# Patient Record
Sex: Male | Born: 2000 | Hispanic: Yes | Marital: Single | Smoking: Never smoker
Health system: Southern US, Community
[De-identification: ages and names within clinical notes are randomized; demographics above are authoritative.]

## PROBLEM LIST (undated history)

## (undated) DIAGNOSIS — S9030XA Contusion of unspecified foot, initial encounter: Secondary | ICD-10-CM

## (undated) DIAGNOSIS — R197 Diarrhea, unspecified: Secondary | ICD-10-CM

## (undated) DIAGNOSIS — N3944 Nocturnal enuresis: Secondary | ICD-10-CM

## (undated) DIAGNOSIS — Z23 Encounter for immunization: Secondary | ICD-10-CM

## (undated) DIAGNOSIS — J309 Allergic rhinitis, unspecified: Secondary | ICD-10-CM

## (undated) DIAGNOSIS — S161XXS Strain of muscle, fascia and tendon at neck level, sequela: Secondary | ICD-10-CM

## (undated) DIAGNOSIS — F32A Depression, unspecified: Secondary | ICD-10-CM

## (undated) DIAGNOSIS — R269 Unspecified abnormalities of gait and mobility: Secondary | ICD-10-CM

## (undated) DIAGNOSIS — F329 Major depressive disorder, single episode, unspecified: Secondary | ICD-10-CM

## (undated) DIAGNOSIS — J4520 Mild intermittent asthma, uncomplicated: Secondary | ICD-10-CM

## (undated) DIAGNOSIS — S060X9A Concussion with loss of consciousness of unspecified duration, initial encounter: Secondary | ICD-10-CM

## (undated) DIAGNOSIS — L209 Atopic dermatitis, unspecified: Secondary | ICD-10-CM

## (undated) DIAGNOSIS — R519 Headache, unspecified: Secondary | ICD-10-CM

## (undated) DIAGNOSIS — S060XAA Concussion with loss of consciousness status unknown, initial encounter: Secondary | ICD-10-CM

## (undated) DIAGNOSIS — H1013 Acute atopic conjunctivitis, bilateral: Secondary | ICD-10-CM

## (undated) DIAGNOSIS — Z87448 Personal history of other diseases of urinary system: Secondary | ICD-10-CM

## (undated) DIAGNOSIS — F909 Attention-deficit hyperactivity disorder, unspecified type: Secondary | ICD-10-CM

## (undated) HISTORY — DX: Personal history of other diseases of urinary system: Z87.448

## (undated) HISTORY — PX: CIRCUMCISION: SUR203

## (undated) HISTORY — DX: Unspecified abnormalities of gait and mobility: R26.9

## (undated) HISTORY — DX: Mild intermittent asthma, uncomplicated: J45.20

## (undated) HISTORY — DX: Concussion with loss of consciousness status unknown, initial encounter: S06.0XAA

## (undated) HISTORY — DX: Depression, unspecified: F32.A

## (undated) HISTORY — DX: Acute atopic conjunctivitis, bilateral: J30.9

## (undated) HISTORY — DX: Concussion with loss of consciousness of unspecified duration, initial encounter: S06.0X9A

## (undated) HISTORY — DX: Headache, unspecified: R51.9

## (undated) HISTORY — DX: Atopic dermatitis, unspecified: L20.9

## (undated) HISTORY — DX: Attention-deficit hyperactivity disorder, unspecified type: F90.9

## (undated) HISTORY — DX: Strain of muscle, fascia and tendon at neck level, sequela: S16.1XXS

## (undated) HISTORY — DX: Allergic rhinitis, unspecified: H10.13

---
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
## 2022-01-08 ENCOUNTER — Other Ambulatory Visit: Payer: Self-pay | Admitting: Family Medicine

## 2022-01-08 DIAGNOSIS — M533 Sacrococcygeal disorders, not elsewhere classified: Secondary | ICD-10-CM

## 2022-02-27 ENCOUNTER — Encounter: Payer: Self-pay | Admitting: *Deleted

## 2022-03-20 ENCOUNTER — Ambulatory Visit (INDEPENDENT_AMBULATORY_CARE_PROVIDER_SITE_OTHER): Payer: Self-pay | Admitting: Physician Assistant

## 2022-03-20 DIAGNOSIS — Z91199 Patient's noncompliance with other medical treatment and regimen due to unspecified reason: Secondary | ICD-10-CM

## 2022-03-20 NOTE — Progress Notes (Signed)
No show

## 2022-03-30 DIAGNOSIS — S22019A Unspecified fracture of first thoracic vertebra, initial encounter for closed fracture: Secondary | ICD-10-CM | POA: Diagnosis not present

## 2022-03-30 DIAGNOSIS — M542 Cervicalgia: Secondary | ICD-10-CM | POA: Diagnosis not present

## 2022-03-30 DIAGNOSIS — R079 Chest pain, unspecified: Secondary | ICD-10-CM | POA: Diagnosis not present

## 2022-03-30 DIAGNOSIS — S134XXA Sprain of ligaments of cervical spine, initial encounter: Secondary | ICD-10-CM | POA: Diagnosis not present

## 2022-03-30 DIAGNOSIS — S3992XA Unspecified injury of lower back, initial encounter: Secondary | ICD-10-CM | POA: Diagnosis not present

## 2022-03-30 DIAGNOSIS — S299XXA Unspecified injury of thorax, initial encounter: Secondary | ICD-10-CM | POA: Diagnosis not present

## 2022-03-30 DIAGNOSIS — M25519 Pain in unspecified shoulder: Secondary | ICD-10-CM | POA: Diagnosis not present

## 2022-03-30 DIAGNOSIS — X58XXXA Exposure to other specified factors, initial encounter: Secondary | ICD-10-CM | POA: Diagnosis not present

## 2022-03-30 DIAGNOSIS — S0990XA Unspecified injury of head, initial encounter: Secondary | ICD-10-CM | POA: Diagnosis not present

## 2022-03-30 DIAGNOSIS — S199XXA Unspecified injury of neck, initial encounter: Secondary | ICD-10-CM | POA: Diagnosis not present

## 2022-03-30 DIAGNOSIS — S3991XA Unspecified injury of abdomen, initial encounter: Secondary | ICD-10-CM | POA: Diagnosis not present

## 2022-03-30 DIAGNOSIS — S3993XA Unspecified injury of pelvis, initial encounter: Secondary | ICD-10-CM | POA: Diagnosis not present

## 2022-03-30 DIAGNOSIS — S6992XA Unspecified injury of left wrist, hand and finger(s), initial encounter: Secondary | ICD-10-CM | POA: Diagnosis not present

## 2022-03-31 DIAGNOSIS — S22019A Unspecified fracture of first thoracic vertebra, initial encounter for closed fracture: Secondary | ICD-10-CM | POA: Diagnosis not present

## 2022-03-31 DIAGNOSIS — S134XXA Sprain of ligaments of cervical spine, initial encounter: Secondary | ICD-10-CM | POA: Diagnosis not present

## 2022-04-02 ENCOUNTER — Encounter: Payer: Self-pay | Admitting: Family Medicine

## 2022-04-02 ENCOUNTER — Ambulatory Visit: Payer: Federal, State, Local not specified - PPO | Admitting: Family Medicine

## 2022-04-02 VITALS — BP 100/65 | HR 82 | Temp 97.5°F | Ht 66.0 in | Wt 150.0 lb

## 2022-04-02 DIAGNOSIS — B351 Tinea unguium: Secondary | ICD-10-CM

## 2022-04-02 DIAGNOSIS — Z23 Encounter for immunization: Secondary | ICD-10-CM

## 2022-04-02 DIAGNOSIS — S22010A Wedge compression fracture of first thoracic vertebra, initial encounter for closed fracture: Secondary | ICD-10-CM

## 2022-04-02 LAB — COMPREHENSIVE METABOLIC PANEL
ALT: 23 U/L (ref 0–53)
AST: 27 U/L (ref 0–37)
Albumin: 4.8 g/dL (ref 3.5–5.2)
Alkaline Phosphatase: 111 U/L (ref 52–171)
BUN: 17 mg/dL (ref 6–23)
CO2: 31 mEq/L (ref 19–32)
Calcium: 9.9 mg/dL (ref 8.4–10.5)
Chloride: 102 mEq/L (ref 96–112)
Creatinine, Ser: 1.1 mg/dL (ref 0.40–1.50)
GFR: 97.57 mL/min (ref >60.00)
Glucose, Bld: 92 mg/dL (ref 70–99)
Potassium: 4.7 mEq/L (ref 3.5–5.1)
Sodium: 139 mEq/L (ref 135–145)
Total Bilirubin: 0.6 mg/dL (ref 0.2–1.2)
Total Protein: 7.8 g/dL (ref 6.0–8.3)

## 2022-04-02 LAB — CBC
HCT: 47.1 % (ref 36.0–49.0)
Hemoglobin: 16.1 g/dL — ABNORMAL HIGH (ref 12.0–16.0)
MCHC: 34.3 g/dL (ref 31.0–37.0)
MCV: 94.8 fl (ref 78.0–98.0)
Platelets: 236 10*3/uL (ref 150.0–575.0)
RBC: 4.97 Mil/uL (ref 3.80–5.70)
RDW: 13.5 % (ref 11.4–15.5)
WBC: 6.1 10*3/uL (ref 4.5–13.5)

## 2022-04-02 MED ORDER — TERBINAFINE HCL 250 MG PO TABS: 250.0000 mg | ORAL_TABLET | Freq: Every day | ORAL | 0 refills | Status: DC

## 2022-04-02 NOTE — Progress Notes (Addendum)
   Rick Knight is a 22 y.o. male who presents today for an office visit.  Assessment/Plan:  T1 Compression Fracture  Reassuring neuroexam today.  Did not have any signs of spinal cord injury on MRI of the C-spine or T-spine in the ED a few days ago.  Does not have any concussion symptoms either.  He has been wearing his C-spine collar consistently.  Pain is reasonably well-controlled with over-the-counter medications.  We will refer him to see a neurosurgeon for further evaluation management.  Anticipate he will do well going forward.  Onychomycosis Have tried topical creams without much improvement.  They are interested in oral treatment.  Will check labs today.  If transaminases are normal we will start terbinafine.   Flu shot given today.    Subjective:  HPI:  Patient here for follow-up.  We last saw him a little over a year ago.He is here today for ED follow-up.  Went to ED 3 days ago after suffering a trauma.  He was at a trampoline park when he did a flip and landed on his head and neck area.  Had immediate pain to the area.  He did have intermittent weakness in his left hand which improved.  In the ED ended up getting CT scan and MRI.  Did not have any signs of spinal cord injury however was found to have an acute superior endplate fracture of T1 and contusions of T2 and T3.  His Ct scan did show that he also had some soft tissue injury in his neck. He was placed in a cervical collar and told to follow-up with neurosurgery.  Over the last few days he has had continued pain in his neck.  He has been taking over-the-counter medications for this which has been working well.  Does occasionally get intermittent numbness and tingling.  No weakness.  No headache.  No vision changes.  No bowel or bladder incontinence.  Numbness and tingling last for a few months it goes away.  Can happen sporadically in his arms or back.  Still has quite a bit of pain and upper back and neck with motion and  activities.        Objective:  Physical Exam: BP 100/65   Pulse 82   Temp (!) 97.5 F (36.4 C) (Temporal)   Ht 5\' 6"  (1.676 m)   Wt 150 lb (68 kg)   SpO2 98%   BMI 24.21 kg/m   Gen: No acute distress, resting comfortably CV: Regular rate and rhythm with no murmurs appreciated Pulm: Normal work of breathing, clear to auscultation bilaterally with no crackles, wheezes, or rhonchi MSK: Cervical collar in place.  Moves all extremities spontaneously.  Strength 5 out of 5 throughout.  Sensation light touch grossly intact throughout. - Foot: Onychomycosis noted. Neuro: Grossly normal, moves all extremities Psych: Normal affect and thought content  Time Spent: 45 minutes of total time was spent on the date of the encounter performing the following actions: chart review prior to seeing the patient including recent ED visit, obtaining history, performing a medically necessary exam, counseling on the treatment plan, placing orders, and documenting in our EHR.        Rick Knight. Jerline Pain, MD 04/02/2022 9:35 AM

## 2022-04-02 NOTE — Patient Instructions (Signed)
It was very nice to see you today!  We will refer you to see the neurosurgeon.  Will check blood work today.  Please do not start the terbinafine until we get your liver test back.  Take care, Dr Jerline Pain  PLEASE NOTE:  If you had any lab tests, please let us know if you have not heard back within a few days. You may see your results on mychart before we have a chance to review them but we will give you a call once they are reviewed by [place].   If we ordered any referrals today, please let us know if you have not heard from their office within the next week.   If you had any urgent prescriptions sent in today, please check with the pharmacy within an hour of our visit to make sure the prescription was transmitted appropriately.   Please try these tips to maintain a healthy lifestyle:  Eat at least 3 REAL meals and 1-2 snacks per day.  Aim for no more than 5 hours between eating.  If you eat breakfast, please do so within one hour of getting up.   Each meal should contain half fruits/vegetables, one quarter protein, and one quarter carbs (no bigger than a computer mouse)  Cut down on sweet beverages. This includes juice, soda, and sweet tea.   Drink at least 1 glass of water with each meal and aim for at least 8 glasses per day  Exercise at least 150 minutes every week.

## 2022-04-04 ENCOUNTER — Encounter: Payer: Self-pay | Admitting: Family Medicine

## 2022-04-04 NOTE — Progress Notes (Signed)
Please inform patient of the following:  Liver function is normal.  It is okay for him to start the terbinafine as we discussed at his office visit.

## 2022-04-06 ENCOUNTER — Encounter: Payer: Self-pay | Admitting: Family Medicine

## 2022-04-07 NOTE — Telephone Encounter (Signed)
See note

## 2022-04-08 NOTE — Telephone Encounter (Signed)
Ok to send updated letter with the requested information.  Algis Greenhouse. Jerline Pain, MD 04/08/2022 8:33 AM

## 2022-04-08 NOTE — Telephone Encounter (Signed)
Patient called for an update on revision of letter for his mother's employer. Requests this be done as soon as possible since mother needs to provide this to employer ASAP.

## 2022-04-09 ENCOUNTER — Encounter: Payer: Self-pay | Admitting: *Deleted

## 2022-04-09 ENCOUNTER — Encounter: Payer: Self-pay | Admitting: Family Medicine

## 2022-04-14 DIAGNOSIS — S22010A Wedge compression fracture of first thoracic vertebra, initial encounter for closed fracture: Secondary | ICD-10-CM | POA: Diagnosis not present

## 2022-04-14 DIAGNOSIS — Z6825 Body mass index (BMI) 25.0-25.9, adult: Secondary | ICD-10-CM | POA: Diagnosis not present

## 2022-04-16 ENCOUNTER — Ambulatory Visit: Payer: Federal, State, Local not specified - PPO | Admitting: Adult Health

## 2022-04-28 DIAGNOSIS — S22010A Wedge compression fracture of first thoracic vertebra, initial encounter for closed fracture: Secondary | ICD-10-CM | POA: Diagnosis not present

## 2022-04-28 DIAGNOSIS — Z6825 Body mass index (BMI) 25.0-25.9, adult: Secondary | ICD-10-CM | POA: Diagnosis not present

## 2022-04-29 ENCOUNTER — Telehealth (INDEPENDENT_AMBULATORY_CARE_PROVIDER_SITE_OTHER): Payer: Federal, State, Local not specified - PPO | Admitting: Physician Assistant

## 2022-04-29 ENCOUNTER — Encounter: Payer: Self-pay | Admitting: Physician Assistant

## 2022-04-29 DIAGNOSIS — F900 Attention-deficit hyperactivity disorder, predominantly inattentive type: Secondary | ICD-10-CM | POA: Diagnosis not present

## 2022-04-29 DIAGNOSIS — F341 Dysthymic disorder: Secondary | ICD-10-CM

## 2022-04-29 MED ORDER — LISDEXAMFETAMINE DIMESYLATE 50 MG PO CAPS: 50.0000 mg | ORAL_CAPSULE | Freq: Every day | ORAL | 0 refills | Status: DC

## 2022-04-29 MED ORDER — BUPROPION HCL ER (XL) 300 MG PO TB24: 300.0000 mg | ORAL_TABLET | Freq: Every day | ORAL | 1 refills | Status: DC

## 2022-04-29 NOTE — Progress Notes (Signed)
[facility]  Patient ID: Rick Knight,  MRN: [id number]  PCP: Vivi Barrack, MD  Date of Evaluation: 04/29/2022 time spent:20 minutes  Chief Complaint:  Chief Complaint   Follow-up    Virtual Visit via Telehealth  I connected with patient by a video enabled telemedicine application with their informed consent, and verified patient privacy and that I am speaking with the correct person using two identifiers.  I am private, in my office and the patient is at school.  I discussed the limitations, risks, security and privacy concerns of performing an evaluation and management service by video and the availability of in person appointments. I also discussed with the patient that there may be a patient responsible charge related to this service. The patient expressed understanding and agreed to proceed.   I discussed the assessment and treatment plan with the patient. The patient was provided an opportunity to ask questions and all were answered. The patient agreed with the plan and demonstrated an understanding of the instructions.   The patient was advised to call back or seek an in-person evaluation if the symptoms worsen or if the condition fails to improve as anticipated.  I provided 20  minutes of non-face-to-face time during this encounter.  HISTORY/CURRENT STATUS: HPI For routine med check.  Fx T1 in January when at a trampoline park, he did a flip and landed on his head.  No surgical intervention. Had to drop classes at [employer] and moved back home, is taking some online classes. Missing part of diving season, he was pretty down about it to begin with but now he feels ok. 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   Patient is able to enjoy things.  Energy and motivation are good.  No extreme sadness, tearfulness, or feelings of hopelessness.  Sleeps well most of the time. ADLs and personal hygiene are normal.    Appetite has not changed.  Weight is  stable. No anxiety to speak of.  Denies suicidal or homicidal thoughts.  States that attention is good without easy distractibility.  Able to focus on things and finish tasks to completion.   Patient denies increased energy with decreased need for sleep, increased talkativeness, racing thoughts, impulsivity or risky behaviors, increased spending, increased libido, grandiosity, increased irritability or anger, paranoia, or hallucinations.  Denies dizziness, syncope, seizures, numbness, tingling, tremor, tics, unsteady gait, slurred speech, confusion. Denies muscle or joint pain, stiffness, or dystonia.  Individual Medical History/ Review of Systems: Changes? :Yes    fx T1 on Jan 14th.   Past medications for mental health diagnoses include: Vyvanse, Wellbutrin  Allergies: Patient has no known allergies.  Current Medications:  Current Outpatient Medications:    terbinafine (LAMISIL) 250 MG tablet, Take 1 tablet (250 mg total) by mouth daily., Disp: 90 tablet, Rfl: 0   buPROPion (WELLBUTRIN XL) 300 MG 24 hr tablet, Take 1 tablet (300 mg total) by mouth daily after breakfast., Disp: 90 tablet, Rfl: 1   lisdexamfetamine (VYVANSE) 50 MG capsule, Take 1 capsule (50 mg total) by mouth daily., Disp: 30 capsule, Rfl: 0 Medication Side Effects: none  Family Medical/ Social History: Changes?  See HPI  MENTAL HEALTH EXAM:  There were no vitals taken for this visit.There is no height or weight on file to calculate BMI.  General Appearance: Casual and Well Groomed  Eye Contact:  Good  Speech:  Clear and Coherent and Normal Rate  Volume:  Normal  Mood:  Euthymic  Affect:  Appropriate  Thought Process:  Goal Directed and Descriptions of Associations: Circumstantial  Orientation:  Full (Time, Place, and Person)  Thought Content: Logical   Suicidal Thoughts:  No  Homicidal Thoughts:  No  Memory:  WNL  Judgement:  Good  Insight:  Good  Psychomotor Activity:  Normal  Concentration:   Concentration: Good and Attention Span: Good  Recall:  Good  Fund of Knowledge: Good  Language: Good  Assets:  Desire for Improvement Financial Resources/Insurance Housing Resilience Transportation  ADL's:  Intact  Cognition: WNL  Prognosis:  Good   DIAGNOSES:    ICD-10-CM   1. Attention deficit hyperactivity disorder (ADHD), inattentive type, moderate  F90.0 buPROPion (WELLBUTRIN XL) 300 MG 24 hr tablet    2. Persistent depressive disorder with mixed features, currently moderate  F34.1 buPROPion (WELLBUTRIN XL) 300 MG 24 hr tablet     Receiving Psychotherapy: No   RECOMMENDATIONS:  PDMP was reviewed.  Vyvanse last filled 06/14/2021. I provided 20 minutes of non-face-to-face time during this encounter, including time spent before and after the visit in records review, medical decision making, counseling pertinent to today's visit, and charting.   I'm glad he's recovering well from his injury. His coach states he needs to be in counseling d/t what has happened w/ the injury. I've recommended some counselors in the area, he'll see if they're covered on his insurance and get an appt with one of them.   He's doing well w/ current meds so no changes needed.   Continue Wellbutrin XL 300 mg, 1 p.o. every morning after breakfast. Continue Vyvanse 50 mg, 1 every morning after breakfast.  He takes it as needed. Return in 4 months  Donnal Moat, [place]

## 2022-05-07 DIAGNOSIS — Z1152 Encounter for screening for COVID-19: Secondary | ICD-10-CM | POA: Diagnosis not present

## 2022-05-07 DIAGNOSIS — R0982 Postnasal drip: Secondary | ICD-10-CM | POA: Diagnosis not present

## 2022-05-07 DIAGNOSIS — J069 Acute upper respiratory infection, unspecified: Secondary | ICD-10-CM | POA: Diagnosis not present

## 2022-05-08 DIAGNOSIS — K080 Exfoliation of teeth due to systemic causes: Secondary | ICD-10-CM | POA: Diagnosis not present

## 2022-06-02 ENCOUNTER — Other Ambulatory Visit: Payer: Self-pay | Admitting: Physician Assistant

## 2022-06-02 ENCOUNTER — Telehealth: Payer: Self-pay | Admitting: Physician Assistant

## 2022-06-02 ENCOUNTER — Other Ambulatory Visit (HOSPITAL_BASED_OUTPATIENT_CLINIC_OR_DEPARTMENT_OTHER): Payer: Self-pay

## 2022-06-02 MED ORDER — LISDEXAMFETAMINE DIMESYLATE 50 MG PO CAPS
50.0000 mg | ORAL_CAPSULE | Freq: Every day | ORAL | 0 refills | Status: DC
  Filled 2022-06-02: qty 30, 30d supply, fill #0

## 2022-06-02 NOTE — Telephone Encounter (Signed)
Rick Knight called to request refill for Vyvanse, he said chewable,  Appt 6/12.  Send to [place] at [facility].

## 2022-06-02 NOTE — Telephone Encounter (Signed)
sent 

## 2022-06-04 ENCOUNTER — Other Ambulatory Visit (HOSPITAL_BASED_OUTPATIENT_CLINIC_OR_DEPARTMENT_OTHER): Payer: Self-pay

## 2022-06-30 ENCOUNTER — Encounter: Payer: Self-pay | Admitting: *Deleted

## 2022-06-30 DIAGNOSIS — S22010A Wedge compression fracture of first thoracic vertebra, initial encounter for closed fracture: Secondary | ICD-10-CM | POA: Diagnosis not present

## 2022-07-29 ENCOUNTER — Other Ambulatory Visit: Payer: Self-pay | Admitting: Family Medicine

## 2022-08-27 ENCOUNTER — Encounter: Payer: Self-pay | Admitting: Physician Assistant

## 2022-08-27 ENCOUNTER — Other Ambulatory Visit (HOSPITAL_BASED_OUTPATIENT_CLINIC_OR_DEPARTMENT_OTHER): Payer: Self-pay

## 2022-08-27 ENCOUNTER — Telehealth (INDEPENDENT_AMBULATORY_CARE_PROVIDER_SITE_OTHER): Payer: Federal, State, Local not specified - PPO | Admitting: Physician Assistant

## 2022-08-27 DIAGNOSIS — F900 Attention-deficit hyperactivity disorder, predominantly inattentive type: Secondary | ICD-10-CM

## 2022-08-27 DIAGNOSIS — F325 Major depressive disorder, single episode, in full remission: Secondary | ICD-10-CM | POA: Diagnosis not present

## 2022-08-27 MED ORDER — BUPROPION HCL ER (SR) 100 MG PO TB12
ORAL_TABLET | ORAL | 0 refills | Status: DC
  Filled 2022-08-27: qty 42, 28d supply, fill #0
  Filled 2022-08-27: qty 21, fill #0

## 2022-08-27 MED ORDER — LISDEXAMFETAMINE DIMESYLATE 30 MG PO CAPS
30.0000 mg | ORAL_CAPSULE | Freq: Every day | ORAL | 0 refills | Status: DC
  Filled 2022-08-27: qty 30, 30d supply, fill #0

## 2022-08-27 NOTE — Progress Notes (Signed)
[facility]  Patient ID: Rick Knight,  MRN: [id number]  PCP: Ardith Dark, MD  Date of Evaluation: 08/27/2022 time spent:20 minutes  Chief Complaint:  Chief Complaint   Depression; ADHD; Follow-up    Virtual Visit via Telehealth  I connected with patient by a video enabled telemedicine application with their informed consent, and verified patient privacy and that I am speaking with the correct person using two identifiers.  I am private, in my office and the patient is at school.  I discussed the limitations, risks, security and privacy concerns of performing an evaluation and management service by video and the availability of in person appointments. I also discussed with the patient that there may be a patient responsible charge related to this service. The patient expressed understanding and agreed to proceed.   I discussed the assessment and treatment plan with the patient. The patient was provided an opportunity to ask questions and all were answered. The patient agreed with the plan and demonstrated an understanding of the instructions.   The patient was advised to call back or seek an in-person evaluation if the symptoms worsen or if the condition fails to improve as anticipated.  I provided 20  minutes of non-face-to-face time during this encounter.  HISTORY/CURRENT STATUS: HPI For routine med check.  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 But there are issues at school and the pool so unable to practice. Glad to be healthy enough to do it when the pool is ready.   Asks about going off the Wellbutrin. We've discussed in the past but it didn't seem like a good time.  He feels okay as far as his mood goes.  No depression symptoms at all.  Patient is able to enjoy things.  Energy and motivation are good.  No extreme sadness, tearfulness, or feelings of hopelessness.  Sleeps well most of the time. ADLs and personal hygiene are normal.  Appetite has not  changed.  Weight is stable. Denies suicidal or homicidal thoughts.  He has not been able to get the Vyvanse in several months.  And the last time he had it was the capsule as chewable was not available.  He has been taking the 50 mg and only taking half.  That has been effective.  At one point he was on Vyvanse 20 mg capsules and that was not quite enough.  He has had trouble focusing since being off of it.  School is going okay though.  He is not working at the moment.  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  Then he will go back to school in the late summer.  Patient denies increased energy with decreased need for sleep, increased talkativeness, racing thoughts, impulsivity or risky behaviors, increased spending, increased libido, grandiosity, increased irritability or anger, paranoia, or hallucinations.  Denies dizziness, syncope, seizures, numbness, tingling, tremor, tics, unsteady gait, slurred speech, confusion. Denies muscle or joint pain, stiffness, or dystonia.  Individual Medical History/ Review of Systems: Changes? :No      Past medications for mental health diagnoses include: Vyvanse, Wellbutrin  Allergies: Patient has no known allergies.  Current Medications:  Current Outpatient Medications:    buPROPion ER (WELLBUTRIN SR) 100 MG 12 hr tablet, 1 tablet (100 mg total) 2 (two) times daily for 14 days, THEN 1 tablet (100 mg total) every morning for 14 days, THEN stop, Disp: 42 tablet, Rfl:  0   lisdexamfetamine (VYVANSE) 30 MG capsule, Take 1 capsule (30 mg total) by mouth daily., Disp: 30 capsule, Rfl: 0 Medication Side Effects: none  Family Medical/ Social History: Changes?  See HPI  MENTAL HEALTH EXAM:  There were no vitals taken for this visit.There is no height or weight on file to calculate BMI.  General Appearance: Casual and Well Groomed  Eye Contact:  Good  Speech:  Clear and Coherent and Normal  Rate  Volume:  Normal  Mood:  Euthymic  Affect:  Appropriate  Thought Process:  Goal Directed and Descriptions of Associations: Circumstantial  Orientation:  Full (Time, Place, and Person)  Thought Content: Logical   Suicidal Thoughts:  No  Homicidal Thoughts:  No  Memory:  WNL  Judgement:  Good  Insight:  Good  Psychomotor Activity:  Normal  Concentration:  Concentration: Good and Attention Span: Good  Recall:  Good  Fund of Knowledge: Good  Language: Good  Assets:  Desire for Improvement Financial Resources/Insurance Housing Resilience Transportation  ADL's:  Intact  Cognition: WNL  Prognosis:  Good   DIAGNOSES:    ICD-10-CM   1. Attention deficit hyperactivity disorder (ADHD), inattentive type, moderate  F90.0     2. Major depressive disorder with single episode, in full remission (HCC)  F32.5      Receiving Psychotherapy: No   RECOMMENDATIONS:  PDMP was reviewed.  Vyvanse last filled 06/14/2021. I provided 20  minutes of non-face-to-face time during this encounter, including time spent before and after the visit in records review, medical decision making, counseling pertinent to today's visit, and charting.   We discussed the medications.  On the Wellbutrin, it is fine to wean off now.  On the Vyvanse I will change to a 30 mg capsule, hopefully he is able to get that.  If he has trouble with either of the medications he will let me know.  Change Wellbutrin to SR 100 mg, 1 p.o. twice daily (he knows not to take the last pill to close to bedtime or it might keep him awake.  Advised to take no later than 2-ish) for 2 weeks, then 1 p.o. every morning for 2 weeks and then stop. Change Vyvanse to 30 mg capsule, 1 p.o. every morning.   Return in 8 weeks.  Melony Overly, PA-C

## 2022-10-20 ENCOUNTER — Telehealth: Payer: Federal, State, Local not specified - PPO | Admitting: Physician Assistant

## 2022-12-11 ENCOUNTER — Telehealth (INDEPENDENT_AMBULATORY_CARE_PROVIDER_SITE_OTHER): Payer: Federal, State, Local not specified - PPO | Admitting: Physician Assistant

## 2022-12-11 ENCOUNTER — Encounter: Payer: Self-pay | Admitting: Physician Assistant

## 2022-12-11 DIAGNOSIS — F3341 Major depressive disorder, recurrent, in partial remission: Secondary | ICD-10-CM | POA: Diagnosis not present

## 2022-12-11 DIAGNOSIS — F900 Attention-deficit hyperactivity disorder, predominantly inattentive type: Secondary | ICD-10-CM | POA: Diagnosis not present

## 2022-12-11 NOTE — Progress Notes (Signed)
[facility]  Patient ID: Rick Knight,  MRN: [id number]  PCP: Ardith Dark, MD  Date of Evaluation: 12/11/2022 time spent: 22 minutes  Chief Complaint:  Chief Complaint   Depression; ADHD   Virtual Visit via Telehealth  I connected with patient by telephone, with their informed consent, and verified patient privacy and that I am speaking with the correct person using two identifiers.  I am private, in my office and the patient is at school.  I discussed the limitations, risks, security and privacy concerns of performing an evaluation and management service by telephone and the availability of in person appointments. I also discussed with the patient that there may be a patient responsible charge related to this service. The patient expressed understanding and agreed to proceed.   I discussed the assessment and treatment plan with the patient. The patient was provided an opportunity to ask questions and all were answered. The patient agreed with the plan and demonstrated an understanding of the instructions.   The patient was advised to call back or seek an in-person evaluation if the symptoms worsen or if the condition fails to improve as anticipated.  I provided 22 minutes of non-face-to-face time during this encounter.  HISTORY/CURRENT STATUS: HPI For routine med check.  Rick Knight is doing well.  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 Grades are good. Meds are working well. States that attention is good without easy distractibility.  Able to focus on things and finish tasks to completion.   Patient is able to enjoy things.  Energy and motivation are good.  No extreme sadness, tearfulness, or feelings of hopelessness.  Sleeps well most of the time. ADLs and personal hygiene are normal.  Appetite has not changed.  Weight is stable. 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  Denies suicidal or homicidal thoughts.  Patient denies increased energy with  decreased need for sleep, increased talkativeness, racing thoughts, impulsivity or risky behaviors, increased spending, increased libido, grandiosity, increased irritability or anger, paranoia, or hallucinations.  Denies dizziness, syncope, seizures, numbness, tingling, tremor, tics, unsteady gait, slurred speech, confusion. Denies muscle or joint pain, stiffness, or dystonia.  Individual Medical History/ Review of Systems: Changes? :No      Past medications for mental health diagnoses include: Vyvanse, Wellbutrin  Allergies: Patient has no known allergies.  Current Medications:  Current Outpatient Medications:    lisdexamfetamine (VYVANSE) 30 MG capsule, Take 1 capsule (30 mg total) by mouth daily., Disp: 30 capsule, Rfl: 0 Medication Side Effects: none  Family Medical/ Social History: Changes?  See HPI  MENTAL HEALTH EXAM:  There were no vitals taken for this visit.There is no height or weight on file to calculate BMI.  General Appearance:  unable to assess  Eye Contact:   unable to assess  Speech:  Clear and Coherent and Normal Rate  Volume:  Normal  Mood:  Euthymic  Affect:   unable to assess  Thought Process:  Goal Directed and Descriptions of Associations: Circumstantial  Orientation:  Full (Time, Place, and Person)  Thought Content: Logical   Suicidal Thoughts:  No  Homicidal Thoughts:  No  Memory:  WNL  Judgement:  Good  Insight:  Good  Psychomotor Activity:   Unable to assess as  Concentration:  Concentration: Good and Attention Span: Good  Recall:  Good  Fund of Knowledge: Good  Language: Good  Assets:  Desire for Improvement Financial Resources/Insurance Housing Physical Health Resilience Transportation  ADL's:  Intact  Cognition: WNL  Prognosis:  Good   DIAGNOSES:    ICD-10-CM   1. Attention deficit hyperactivity disorder (ADHD), inattentive type, moderate  F90.0     2. Recurrent major depressive disorder, in partial remission (HCC)  F33.41       Receiving Psychotherapy: No   RECOMMENDATIONS:  PDMP was reviewed.  Vyvanse last filled 08/27/2022.  I provided 22 minutes of non-face-to-face time during this encounter, including time spent before and after the visit in records review, medical decision making, counseling pertinent to today's visit, and charting.   He's doing well so no change needed. Continue healthy lifestyle choices.  Continue Vyvanse 30 mg capsule, 1 p.o. every morning.   Return in 3 months.  Melony Overly, PA-C

## 2022-12-29 ENCOUNTER — Other Ambulatory Visit: Payer: Self-pay

## 2022-12-29 ENCOUNTER — Telehealth: Payer: Self-pay | Admitting: Physician Assistant

## 2022-12-29 MED ORDER — LISDEXAMFETAMINE DIMESYLATE 30 MG PO CAPS: 30.0000 mg | ORAL_CAPSULE | Freq: Every day | ORAL | 0 refills | Status: DC

## 2022-12-29 NOTE — Telephone Encounter (Signed)
Rick Knight called at 3:27 to request refill of his Vyvanse. Appt 12/30.  Send to [id number] - [place], [place] - [address] AT [facility] [address]   He thought you had already sent at the last appt 9/26.  But I don't see that it was done.

## 2022-12-29 NOTE — Telephone Encounter (Signed)
pended

## 2023-02-11 ENCOUNTER — Telehealth: Payer: Self-pay | Admitting: Physician Assistant

## 2023-02-11 ENCOUNTER — Other Ambulatory Visit: Payer: Self-pay

## 2023-02-11 MED ORDER — LISDEXAMFETAMINE DIMESYLATE 30 MG PO CAPS: 30.0000 mg | ORAL_CAPSULE | Freq: Every day | ORAL | 0 refills | Status: DC

## 2023-02-11 NOTE — Telephone Encounter (Signed)
Pended vyvanse to requested pharmacy.

## 2023-02-11 NOTE — Telephone Encounter (Signed)
Next visit is 03/16/23. Requesting refill on Vyvanse called to:  [id number] - [place], [place] - [address] AT [address]   Phone: [phone]  Fax: [phone]    It is in stock per patient.

## 2023-03-09 ENCOUNTER — Ambulatory Visit: Payer: Federal, State, Local not specified - PPO | Admitting: Physician Assistant

## 2023-03-16 ENCOUNTER — Ambulatory Visit: Payer: Federal, State, Local not specified - PPO | Admitting: Physician Assistant

## 2023-04-16 ENCOUNTER — Telehealth: Payer: Federal, State, Local not specified - PPO | Admitting: Physician Assistant

## 2023-04-16 ENCOUNTER — Encounter: Payer: Self-pay | Admitting: Physician Assistant

## 2023-04-16 DIAGNOSIS — F900 Attention-deficit hyperactivity disorder, predominantly inattentive type: Secondary | ICD-10-CM | POA: Diagnosis not present

## 2023-04-16 MED ORDER — LISDEXAMFETAMINE DIMESYLATE 30 MG PO CAPS: 30.0000 mg | ORAL_CAPSULE | Freq: Every day | ORAL | 0 refills | Status: DC

## 2023-04-16 NOTE — Addendum Note (Signed)
Addended by: Melony Overly T on: 04/16/2023 05:26 PM   Modules accepted: Level of Service

## 2023-04-16 NOTE — Progress Notes (Signed)
[facility]  Patient ID: Rick Knight,  MRN: [id number]  PCP: Ardith Dark, MD  Date of Evaluation: 04/16/2023 time spent:20 minutes  Chief Complaint:  Chief Complaint   ADHD; Follow-up   Virtual Visit via Telehealth  I connected with patient by a video enabled telemedicine application with their informed consent, and verified patient privacy and that I am speaking with the correct person using two identifiers.  I am private, in my office and the patient is on campus at [facility].  I discussed the limitations, risks, security and privacy concerns of performing an evaluation and management service by video and the availability of in person appointments. I also discussed with the patient that there may be a patient responsible charge related to this service. The patient expressed understanding and agreed to proceed.   I discussed the assessment and treatment plan with the patient. The patient was provided an opportunity to ask questions and all were answered. The patient agreed with the plan and demonstrated an understanding of the instructions.   The patient was advised to call back or seek an in-person evaluation if the symptoms worsen or if the condition fails to improve as anticipated.  I provided 20  minutes of non-face-to-face time during this encounter.  HISTORY/CURRENT STATUS: HPI For routine med check.  He is doing very well.  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  Still enjoying that quite a bit, grades are good.  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  Vyvanse is still effective.  He does not take it every day. States that attention is good without easy distractibility.  Able to focus on things and finish tasks to completion.   Patient is able to enjoy things.  Energy and motivation are good.  No extreme sadness, tearfulness, or feelings of hopelessness.  Sleeps well most of the time. ADLs and personal hygiene are normal.  Appetite has not  changed.  Weight is stable.  No complaints of anxiety.  Denies suicidal or homicidal thoughts.  Patient denies increased energy with decreased need for sleep, increased talkativeness, racing thoughts, impulsivity or risky behaviors, increased spending, increased libido, grandiosity, increased irritability or anger, paranoia, or hallucinations.  Denies dizziness, syncope, seizures, numbness, tingling, tremor, tics, unsteady gait, slurred speech, confusion. Denies muscle or joint pain, stiffness, or dystonia.  Individual Medical History/ Review of Systems: Changes? :No      Past medications for mental health diagnoses include: Vyvanse, Wellbutrin  Allergies: Patient has no known allergies.  Current Medications:  Current Outpatient Medications:    lisdexamfetamine (VYVANSE) 30 MG capsule, Take 1 capsule (30 mg total) by mouth daily., Disp: 30 capsule, Rfl: 0   [START ON 05/15/2023] lisdexamfetamine (VYVANSE) 30 MG capsule, Take 1 capsule (30 mg total) by mouth daily., Disp: 30 capsule, Rfl: 0   [START ON 06/11/2023] lisdexamfetamine (VYVANSE) 30 MG capsule, Take 1 capsule (30 mg total) by mouth daily., Disp: 30 capsule, Rfl: 0 Medication Side Effects: none  Family Medical/ Social History: Changes?  See HPI  MENTAL HEALTH EXAM:  There were no vitals taken for this visit.There is no height or weight on file to calculate BMI.  General Appearance: Casual and Well Groomed  Eye Contact:  Good  Speech:  Clear and Coherent and Normal Rate  Volume:  Normal  Mood:  Euthymic  Affect:  Congruent  Thought Process:  Goal Directed and Descriptions of Associations: Circumstantial  Orientation:  Full (Time, Place, and  Person)  Thought Content: Logical   Suicidal Thoughts:  No  Homicidal Thoughts:  No  Memory:  WNL  Judgement:  Good  Insight:  Good  Psychomotor Activity:  Normal  Concentration:  Concentration: Good and Attention Span: Good  Recall:  Good  Fund of Knowledge: Good  Language: Good   Assets:  Desire for Improvement Financial Resources/Insurance Housing Physical Health Resilience Transportation  ADL's:  Intact  Cognition: WNL  Prognosis:  Good   DIAGNOSES:    ICD-10-CM   1. 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  Vyvanse last filled 02/11/2023. I provided 20 minutes of non-face-to-face time during this encounter, including time spent before and after the visit in records review, medical decision making, counseling pertinent to today's visit, and charting.   He is doing well on current treatment so no changes will be made.  Continue Vyvanse 30 mg capsule, 1 p.o. every morning, as needed. Return in 6 months.  Melony Overly, PA-C

## 2023-12-03 ENCOUNTER — Other Ambulatory Visit: Payer: Self-pay

## 2023-12-03 ENCOUNTER — Telehealth: Payer: Self-pay | Admitting: Physician Assistant

## 2023-12-03 DIAGNOSIS — F900 Attention-deficit hyperactivity disorder, predominantly inattentive type: Secondary | ICD-10-CM

## 2023-12-03 MED ORDER — LISDEXAMFETAMINE DIMESYLATE 30 MG PO CAPS
30.0000 mg | ORAL_CAPSULE | Freq: Every day | ORAL | 0 refills | Status: AC
Start: 2023-12-03 — End: ?

## 2023-12-03 NOTE — Telephone Encounter (Signed)
 Pended

## 2023-12-03 NOTE — Telephone Encounter (Signed)
 Pt called and has an apt 01/05/24. He also needs a refill on his vyvanse  30 mg. [facility] [address] in [place]

## 2023-12-23 DIAGNOSIS — Z13220 Encounter for screening for lipoid disorders: Secondary | ICD-10-CM | POA: Diagnosis not present

## 2023-12-23 DIAGNOSIS — Z136 Encounter for screening for cardiovascular disorders: Secondary | ICD-10-CM | POA: Diagnosis not present

## 2023-12-23 DIAGNOSIS — F331 Major depressive disorder, recurrent, moderate: Secondary | ICD-10-CM | POA: Diagnosis not present

## 2023-12-23 DIAGNOSIS — Z1330 Encounter for screening examination for mental health and behavioral disorders, unspecified: Secondary | ICD-10-CM | POA: Diagnosis not present

## 2023-12-23 DIAGNOSIS — Z131 Encounter for screening for diabetes mellitus: Secondary | ICD-10-CM | POA: Diagnosis not present

## 2023-12-23 DIAGNOSIS — Z0000 Encounter for general adult medical examination without abnormal findings: Secondary | ICD-10-CM | POA: Diagnosis not present

## 2023-12-25 DIAGNOSIS — Z136 Encounter for screening for cardiovascular disorders: Secondary | ICD-10-CM | POA: Diagnosis not present

## 2023-12-25 DIAGNOSIS — Z131 Encounter for screening for diabetes mellitus: Secondary | ICD-10-CM | POA: Diagnosis not present

## 2023-12-25 DIAGNOSIS — Z13220 Encounter for screening for lipoid disorders: Secondary | ICD-10-CM | POA: Diagnosis not present

## 2023-12-25 DIAGNOSIS — E291 Testicular hypofunction: Secondary | ICD-10-CM | POA: Diagnosis not present

## 2023-12-25 DIAGNOSIS — F900 Attention-deficit hyperactivity disorder, predominantly inattentive type: Secondary | ICD-10-CM | POA: Diagnosis not present
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
## 2024-01-05 ENCOUNTER — Telehealth (INDEPENDENT_AMBULATORY_CARE_PROVIDER_SITE_OTHER): Admitting: Physician Assistant

## 2024-01-05 ENCOUNTER — Encounter: Payer: Self-pay | Admitting: Physician Assistant

## 2024-01-05 DIAGNOSIS — F330 Major depressive disorder, recurrent, mild: Secondary | ICD-10-CM | POA: Diagnosis not present

## 2024-01-05 DIAGNOSIS — F900 Attention-deficit hyperactivity disorder, predominantly inattentive type: Secondary | ICD-10-CM | POA: Diagnosis not present

## 2024-01-05 MED ORDER — LISDEXAMFETAMINE DIMESYLATE 30 MG PO CAPS: 30.0000 mg | ORAL_CAPSULE | Freq: Every day | ORAL | 0 refills | Status: DC

## 2024-01-05 MED ORDER — LISDEXAMFETAMINE DIMESYLATE 30 MG PO CAPS
30.0000 mg | ORAL_CAPSULE | Freq: Every day | ORAL | 0 refills | Status: AC
Start: 2024-02-04 — End: ?

## 2024-01-05 MED ORDER — BUPROPION HCL ER (XL) 150 MG PO TB24: 150.0000 mg | ORAL_TABLET | Freq: Every day | ORAL | 1 refills | Status: AC

## 2024-01-05 NOTE — Progress Notes (Unsigned)
 [facility]  Patient ID: Rick Knight,  MRN: [id number]  PCP: Kennyth Worth HERO, MD  Date of Evaluation: 01/05/2024 time spent:20 minutes  Chief Complaint:  Chief Complaint   ADHD; Follow-up    Virtual Visit via Telehealth  I connected with patient by a video enabled telemedicine application with their informed consent, and verified patient privacy and that I am speaking with the correct person using two identifiers.  I am private, in my office and the patient is on campus at [facility].  I discussed the limitations, risks, security and privacy concerns of performing an evaluation and management service by video and the availability of in person appointments. I also discussed with the patient that there may be a patient responsible charge related to this service. The patient expressed understanding and agreed to proceed.   I discussed the assessment and treatment plan with the patient. The patient was provided an opportunity to ask questions and all were answered. The patient agreed with the plan and demonstrated an understanding of the instructions.   The patient was advised to call back or seek an in-person evaluation if the symptoms worsen or if the condition fails to improve as anticipated.  I provided 20  minutes of non-face-to-face time during this encounter.  HISTORY/CURRENT STATUS: HPI For routine med check.  Doing well on the Vyvanse . Some days he feels kind of like a robot, but it's helping with focus.   Has been experiencing some depression.  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  He is doing well in school.  But there are times when he feels overwhelmed, is starting to think about what is next after college?  Some days he does not have a lot of energy and does not have his usual baseline pep.   ADLs and personal hygiene are normal.  Sleeps well most of the time.  Appetite has not changed.  No panic attacks.  Another  issue is decreased libido.  He saw his primary provider and had labs done to rule out low testosterone or something else going on.  It was all normal.  No mania, delirium, AH/VH.  No SI/HI.  Individual Medical History/ Review of Systems: Changes? :No      Past medications for mental health diagnoses include: Vyvanse , Wellbutrin   Allergies: Patient has no known allergies.  Current Medications:  Current Outpatient Medications:    buPROPion  (WELLBUTRIN  XL) 150 MG 24 hr tablet, Take 1 tablet (150 mg total) by mouth daily., Disp: 30 tablet, Rfl: 1   lisdexamfetamine (VYVANSE ) 30 MG capsule, Take 1 capsule (30 mg total) by mouth daily., Disp: 30 capsule, Rfl: 0   [START ON 02/04/2024] lisdexamfetamine (VYVANSE ) 30 MG capsule, Take 1 capsule (30 mg total) by mouth daily., Disp: 30 capsule, Rfl: 0   [START ON 03/04/2024] lisdexamfetamine (VYVANSE ) 30 MG capsule, Take 1 capsule (30 mg total) by mouth daily., Disp: 30 capsule, Rfl: 0 Medication Side Effects: none  Family Medical/ Social History: Changes?     MENTAL HEALTH EXAM:  There were no vitals taken for this visit.There is no height or weight on file to calculate BMI.  General Appearance: Casual and Well Groomed  Eye Contact:  Good  Speech:  Clear and Coherent and Normal Rate  Volume:  Normal  Mood:  sad  Affect:  Congruent  Thought Process:  Goal Directed and Descriptions of Associations: Circumstantial  Orientation:  Full (Time, Place, and Person)  Thought  Content: Logical   Suicidal Thoughts:  No  Homicidal Thoughts:  No  Memory:  WNL  Judgement:  Good  Insight:  Good  Psychomotor Activity:  Normal  Concentration:  Concentration: Good and Attention Span: Good  Recall:  Good  Fund of Knowledge: Good  Language: Good  Assets:  Desire for Improvement Financial Resources/Insurance Housing Physical Health Resilience Transportation  ADL's:  Intact  Cognition: WNL  Prognosis:  Good   DIAGNOSES:    ICD-10-CM   1. Mild  recurrent major depression  F33.0     2. Attention deficit hyperactivity disorder (ADHD), inattentive type, moderate  F90.0 lisdexamfetamine (VYVANSE ) 30 MG capsule     Receiving Psychotherapy: No   RECOMMENDATIONS:  PDMP was reviewed.  Vyvanse  last filled 12/03/2023. I provided approximately 20 minutes of non-face-to-face time during this encounter, including time spent before and after the visit in records review, medical decision making, counseling pertinent to today's visit, and charting.   We discussed his symptoms of depression.  He has taken Wellbutrin  in the past which did help.  States he does not want to rely on anything to help his mood but I encouraged him to restart this, I think it would give him more energy, clarity of mind and could possibly help with the libido.  We discussed that as well and the fact that depression and cause it.  He wants to do a little research before he starts it.  I recommend him either going to [employer] or [facility].  I will also include information on the after visit summary.  Start Wellbutrin  XL 150 mg, 1 p.o. every morning. Continue Vyvanse  30 mg capsule, 1 p.o. every morning, as needed. Return in 6-8 weeks.  Verneita Cooks, PA-C

## 2024-01-07 ENCOUNTER — Encounter: Payer: Self-pay | Admitting: Physician Assistant

## 2024-02-25 ENCOUNTER — Telehealth: Admitting: Physician Assistant

## 2024-02-25 ENCOUNTER — Encounter: Payer: Self-pay | Admitting: Physician Assistant

## 2024-02-25 DIAGNOSIS — F325 Major depressive disorder, single episode, in full remission: Secondary | ICD-10-CM

## 2024-02-25 DIAGNOSIS — F900 Attention-deficit hyperactivity disorder, predominantly inattentive type: Secondary | ICD-10-CM | POA: Diagnosis not present

## 2024-02-25 MED ORDER — LISDEXAMFETAMINE DIMESYLATE 30 MG PO CAPS: 30.0000 mg | ORAL_CAPSULE | Freq: Every day | ORAL | 0 refills | Status: AC

## 2024-02-25 NOTE — Progress Notes (Signed)
 [facility]  Patient ID: Rick Knight,  MRN: [id number]  PCP: Kennyth Worth HERO, MD  Date of Evaluation: 02/25/2024 time spent:20 minutes  Chief Complaint:  Chief Complaint   ADHD; Depression; Follow-up    Virtual Visit via Telehealth  I connected with patient by a video enabled telemedicine application with their informed consent, and verified patient privacy and that I am speaking with the correct person using two identifiers.  I am private, in my office and the patient is on campus at [facility].  I discussed the limitations, risks, security and privacy concerns of performing an evaluation and management service by video and the availability of in person appointments. I also discussed with the patient that there may be a patient responsible charge related to this service. The patient expressed understanding and agreed to proceed.   I discussed the assessment and treatment plan with the patient. The patient was provided an opportunity to ask questions and all were answered. The patient agreed with the plan and demonstrated an understanding of the instructions.   The patient was advised to call back or seek an in-person evaluation if the symptoms worsen or if the condition fails to improve as anticipated.  I provided 20  minutes of non-face-to-face time during this encounter.  HISTORY/CURRENT STATUS: HPI For routine med check.  Rick Knight is doing well.  He did not need to start the Wellbutrin .  He is glad he has it just in case he needs it.  But he is not having any symptoms of depression.  Patient is able to enjoy things.  Energy and motivation are good.  School went well, he made good grades this past semester.  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  No extreme sadness, tearfulness, or feelings of hopelessness.  Sleeps okay.  ADLs and personal hygiene are normal.   The Vyvanse  is still working well.   States that attention is good without easy distractibility.  Able to focus on things and finish  tasks to completion.  Appetite has not changed.  No reports of anxiety.  No mania, delirium, AH/VH.  No SI/HI.  Individual Medical History/ Review of Systems: Changes? :No      Past medications for mental health diagnoses include: Vyvanse , Wellbutrin   Allergies: Patient has no known allergies.  Current Medications:  Current Outpatient Medications:    buPROPion  (WELLBUTRIN  XL) 150 MG 24 hr tablet, Take 1 tablet (150 mg total) by mouth daily. (Patient not taking: Reported on 02/25/2024), Disp: 30 tablet, Rfl: 1   lisdexamfetamine (VYVANSE ) 30 MG capsule, Take 1 capsule (30 mg total) by mouth daily., Disp: 30 capsule, Rfl: 0   [START ON 03/25/2024] lisdexamfetamine (VYVANSE ) 30 MG capsule, Take 1 capsule (30 mg total) by mouth daily., Disp: 30 capsule, Rfl: 0   [START ON 04/23/2024] lisdexamfetamine (VYVANSE ) 30 MG capsule, Take 1 capsule (30 mg total) by mouth daily., Disp: 30 capsule, Rfl: 0 Medication Side Effects: none  Family Medical/ Social History: Changes?   No  MENTAL HEALTH EXAM:  There were no vitals taken for this visit.There is no height or weight on file to calculate BMI.  General Appearance: Casual and Well Groomed  Eye Contact:  Good  Speech:  Clear and Coherent and Normal Rate  Volume:  Normal  Mood:  Euthymic  Affect:  Congruent  Thought Process:  Goal Directed and Descriptions of Associations: Circumstantial  Orientation:  Full (Time, Place, and Person)  Thought Content: Logical   Suicidal Thoughts:  No  Homicidal Thoughts:  No  Memory:  WNL  Judgement:  Good  Insight:  Good  Psychomotor Activity:  Normal  Concentration:  Concentration: Good and Attention Span: Good  Recall:  Good  Fund of Knowledge: Good  Language: Good  Assets:  Desire for Improvement Financial Resources/Insurance Housing Physical Health Resilience Transportation  ADL's:  Intact  Cognition: WNL  Prognosis:  Good   DIAGNOSES:    ICD-10-CM   1. Attention deficit hyperactivity disorder  (ADHD), inattentive type, moderate  F90.0 lisdexamfetamine (VYVANSE ) 30 MG capsule    2. Major depressive disorder with single episode, in full remission  F32.5      Receiving Psychotherapy: No   RECOMMENDATIONS:  PDMP was reviewed.  Vyvanse  last filled 01/06/2024. I provided approximately  20 minutes of nonface to face time during this encounter, including time spent before and after the visit in records review, medical decision making, counseling pertinent to today's visit, and charting.   He did not need to start the Wellbutrin .  He has it on hand and will start it if needed.  He needs an appointment in 6 weeks after he starts it, if/when he does so.  Continue Vyvanse  30 mg capsule, 1 p.o. every morning, as needed. Return in 6 months.  Verneita Cooks, PA-C

## 2024-03-03 ENCOUNTER — Other Ambulatory Visit: Payer: Self-pay | Admitting: Physician Assistant
